# Patient Record
Sex: Male | Born: 1960 | Race: Black or African American | Hispanic: No | Marital: Married | Smoking: Never smoker
Health system: Southern US, Community
[De-identification: ages and names within clinical notes are randomized; demographics above are authoritative.]

## PROBLEM LIST (undated history)

## (undated) DIAGNOSIS — IMO0001 Reserved for inherently not codable concepts without codable children: Secondary | ICD-10-CM

## (undated) DIAGNOSIS — E669 Obesity, unspecified: Secondary | ICD-10-CM

## (undated) DIAGNOSIS — R030 Elevated blood-pressure reading, without diagnosis of hypertension: Secondary | ICD-10-CM

## (undated) HISTORY — DX: Reserved for inherently not codable concepts without codable children: IMO0001

## (undated) HISTORY — DX: Obesity, unspecified: E66.9

## (undated) HISTORY — PX: COLONOSCOPY: SHX174

## (undated) HISTORY — DX: Elevated blood-pressure reading, without diagnosis of hypertension: R03.0

## (undated) HISTORY — PX: VASECTOMY: SHX75

---

## 2005-09-26 ENCOUNTER — Ambulatory Visit: Payer: Self-pay | Admitting: Internal Medicine

## 2005-10-04 ENCOUNTER — Ambulatory Visit: Payer: Self-pay | Admitting: Internal Medicine

## 2006-10-12 ENCOUNTER — Encounter: Payer: Self-pay | Admitting: Internal Medicine

## 2009-04-12 ENCOUNTER — Ambulatory Visit: Payer: Self-pay | Admitting: Internal Medicine

## 2009-04-12 DIAGNOSIS — R030 Elevated blood-pressure reading, without diagnosis of hypertension: Secondary | ICD-10-CM | POA: Insufficient documentation

## 2009-04-12 DIAGNOSIS — E669 Obesity, unspecified: Secondary | ICD-10-CM | POA: Insufficient documentation

## 2009-04-13 ENCOUNTER — Encounter: Payer: Self-pay | Admitting: Internal Medicine

## 2009-10-12 ENCOUNTER — Ambulatory Visit: Payer: Self-pay | Admitting: Internal Medicine

## 2009-10-12 DIAGNOSIS — R748 Abnormal levels of other serum enzymes: Secondary | ICD-10-CM | POA: Insufficient documentation

## 2009-10-12 DIAGNOSIS — R7309 Other abnormal glucose: Secondary | ICD-10-CM | POA: Insufficient documentation

## 2009-10-12 LAB — CONVERTED CEMR LAB
ALT: 26 units/L (ref 0–53)
AST: 24 units/L (ref 0–37)
Albumin: 4.2 g/dL (ref 3.5–5.2)
BUN: 13 mg/dL (ref 6–23)
CO2: 27 meq/L (ref 19–32)
Chloride: 106 meq/L (ref 96–112)
GGT: 33 units/L (ref 7–51)
Glucose, Bld: 83 mg/dL (ref 70–99)
Potassium: 4.4 meq/L (ref 3.5–5.3)

## 2009-10-14 ENCOUNTER — Telehealth: Payer: Self-pay | Admitting: Internal Medicine

## 2009-10-15 ENCOUNTER — Encounter: Payer: Self-pay | Admitting: Internal Medicine

## 2009-10-18 ENCOUNTER — Encounter (INDEPENDENT_AMBULATORY_CARE_PROVIDER_SITE_OTHER): Payer: Self-pay | Admitting: *Deleted

## 2009-11-01 ENCOUNTER — Encounter (HOSPITAL_COMMUNITY): Admission: RE | Admit: 2009-11-01 | Discharge: 2010-01-10 | Payer: Self-pay | Admitting: Internal Medicine

## 2009-11-03 ENCOUNTER — Telehealth: Payer: Self-pay | Admitting: Internal Medicine

## 2010-01-17 ENCOUNTER — Ambulatory Visit: Payer: Self-pay | Admitting: Internal Medicine

## 2010-03-06 LAB — CONVERTED CEMR LAB
AST: 25 units/L (ref 0–37)
Alkaline Phosphatase: 128 units/L — ABNORMAL HIGH (ref 39–117)
Bilirubin, Direct: 0.1 mg/dL (ref 0.0–0.3)
CO2: 27 meq/L (ref 19–32)
Calcium: 8.9 mg/dL (ref 8.4–10.5)
Creatinine, Ser: 1.02 mg/dL (ref 0.40–1.50)
Glucose, Bld: 88 mg/dL (ref 70–99)
HCT: 41 % (ref 39.0–52.0)
HDL: 64 mg/dL (ref >39)
MCV: 87.8 fL (ref 78.0–100.0)
Platelets: 254 10*3/uL (ref 150–400)
Total Bilirubin: 0.3 mg/dL (ref 0.3–1.2)
WBC: 5.5 10*3/uL (ref 4.0–10.5)

## 2010-03-08 NOTE — Letter (Signed)
   [place] at [facility] [address] [place], [place]  [address]  [place] Phone: [phone]      April 13, 2009   Jeffrey Oconnor [address] [place], [place] [address]  RE:  LAB RESULTS  Dear  [name],  The following is an interpretation of your most recent lab tests.  Please take note of any instructions provided or changes to medications that have resulted from your lab work.  ELECTROLYTES:  Good - no changes needed  KIDNEY FUNCTION TESTS:  Good - no changes needed  LIVER FUNCTION TESTS:  Stable - no changes needed  LIPID PANEL:  Good - no changes needed Triglyceride: 36   Cholesterol: 169   LDL: 98   HDL: 64   Chol/HDL%:  2.6 Ratio  THYROID STUDIES:  Thyroid studies normal TSH: 2.544      CBC:  Good - no changes needed  Your 3 month blood sugar test (HgA1c) is slightly elevated.  Please make lifestyle changes as discussed during our office visit.       Sincerely Yours,    Dr. Thomos Lemons

## 2010-03-08 NOTE — Assessment & Plan Note (Signed)
Summary: 6 month follow up/mhf   Vital Signs:  Patient profile:   50 year old male Height:      68 inches Weight:      224.25 pounds BMI:     34.22 O2 Sat:      99 % on Room air Temp:     98.0 degrees F oral Pulse rate:   58 / minute Pulse rhythm:   regular Resp:     20 per minute BP sitting:   138 / 90  (right arm) Cuff size:   large  Vitals Entered By: Glendell Docker CMA (October 12, 2009 9:09 AM)  O2 Flow:  Room air  Primary Care Provider:  D. Thomos Lemons DO   History of Present Illness: 50 y/o male with hx of elevated bp w/o dx of htn for f/u he has not been monitoring home bp readings reviewed prev blood test results  Preventive Screening-Counseling & Management  Alcohol-Tobacco     Smoking Status: never  Allergies (verified): No Known Drug Allergies  Past History:  Past Medical History: Hx of elevated alk phos Obesity Elevated BP w/o diagnosis of Htn   Past Surgical History: Denies surgical history   Family History: Family History Hypertension - mother No diabetes No colon ca no prostate ca    Social History: Occupation: Presenter, 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   Lungs:  normal respiratory effort, normal breath sounds, and no wheezes.   Heart:  normal rate, regular rhythm, and no gallop.   Abdomen:  soft and non-tender.  mild abd obesity Extremities:  No lower extremity edema    Impression & Recommendations:  Problem # 1:  HYPERGLYCEMIA (ICD-790.29) A1c from March, 2011 6.0 - borderline.  brother is diabetic. Pt counseled on diet and exercise. I provided educational material re:  carb counting  Orders: T- Hemoglobin A1C ([id number]) T-Basic Metabolic Panel ([id number])  Problem # 2:  ELEVATED BLOOD PRESSURE WITHOUT DIAGNOSIS OF HYPERTENSION (ICD-796.2) pt has not been monitoring bp at home.   limited exercise.  wife is CMA.   If home BP readings > 140/80, we discussed started anti hypertensive  BP today: 138/90 Prior BP: 130/100 (04/12/2009)  Labs Reviewed: Creat: 1.02 (04/12/2009) Chol: 169 (04/12/2009)   HDL: 64 (04/12/2009)   LDL: 98 (04/12/2009)   TG: 36 (04/12/2009)  Problem # 3:  ALKALINE PHOSPHATASE, ELEVATED (ICD-790.5) pt with isolated alk phos elevation.  question liver vs bone source.  check GGTP Orders: T-Hepatic Function ([id number]) T- * Misc. Laboratory test [id number])  Patient Instructions: 1)  Please schedule a follow-up appointment in 3 months. 2)  Bring blood pressure readings to your next OV  Current Allergies (reviewed today): No known allergies       Contraindications/Deferment of Procedures/Staging:    Test/Procedure: FLU VAX    Reason for deferment: patient declined

## 2010-03-08 NOTE — Progress Notes (Signed)
Summary: Lab Results  Phone Note Outgoing Call   Summary of Call: call pt - electrolytes and kidney function normal.   Alkaline phosphatase still slightly elevated.  elevation does not appear to be coming from liver.   I may be related to bone abnormality.  I suggest we schedule bone scan.  see orders Initial call taken by: D. Thomos Lemons DO,  October 14, 2009 2:07 PM  Follow-up for Phone Call        call placed to patient at [phone], no answer, no voice mail Glendell Docker [facility]  October 14, 2009 3:10 PM   Additional Follow-up for Phone Call Additional follow up Details #1::        Attempted to reach pt. No answer, no voicemail. Nicki Guadalajara Fergerson CMA Duncan Dull)  October 15, 2009 8:05 AM     Additional Follow-up for Phone Call Additional follow up Details #2::    letter mailed to patient informin him per Dr Artist Pais instructions Follow-up by: Glendell Docker CMA,  October 15, 2009 9:08 AM

## 2010-03-08 NOTE — Assessment & Plan Note (Signed)
Summary: Fasting ncpx- jr   Vital Signs:  Patient profile:   50 year old male Height:      68 inches Weight:      221.50 pounds BMI:     33.80 O2 Sat:      100 % on Room air Temp:     97.7 degrees F rectal Pulse rate:   59 / minute Pulse rhythm:   regular Resp:     18 per minute BP sitting:   130 / 100  (right arm) Cuff size:   large  Vitals Entered By: Glendell Docker CMA (April 12, 2009 9:39 AM)  O2 Flow:  Room air CC: RM 2- NPX Is Patient Diabetic? No Comments fasting for labs, new patient physical   Primary Care Provider:  Dondra Spry DO  CC:  RM 2- NPX.  History of Present Illness: 50 y/o male to establish and for routine  hx of  elevated BP,   gave blood 2-3 weeks ago - bp was 122/80 does not exercise regularly wt history -  highest wt 230 no hx of DM II no hx of hyperlipidemia   Preventive Screening-Counseling & Management  Alcohol-Tobacco     Alcohol drinks/day: 0     Smoking Status: never  Caffeine-Diet-Exercise     Caffeine use/day: less than one er day     Does Patient Exercise: no  Allergies (verified): No Known Drug Allergies  Past History:  Past Medical History: Hx of elevated alk phos Obesity Elevated BP w/o diagnosis of Htn  Past Surgical History: Denies surgical history  Family History: Family History Hypertension - mother No diabetes No colon ca no prostate ca  Social History: Occupation: Presenter, 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    Physical Exam  General:  alert, well-developed, and well-nourished.   Neck:  supple, no masses, and no carotid bruits.   Lungs:  normal respiratory effort,  normal breath sounds, and no wheezes.   Heart:  normal rate, regular rhythm, and no gallop.   Abdomen:  soft, non-tender, normal bowel sounds, no abdominal hernia, and no hepatomegaly.   Extremities:  No lower extremity edema  Neurologic:  cranial nerves II-XII intact and gait normal.   Psych:  normally interactive, good eye contact, not anxious appearing, and not depressed appearing.     Impression & Recommendations:  Problem # 1:  PREVENTIVE HEALTH CARE (ICD-V70.0) Reviewed adult health maintenance protocols.  Pt counseled on diet and exercise.  Orders: T-Basic Metabolic Panel [id number]) T-Hepatic Function [phone]) T-Lipid Profile [id number]) T-CBC No Diff ([id number]) T- Hemoglobin A1C ([id number]) T-TSH ([id number]) EKG w/ Interpretation (93000)  D  Patient Instructions: 1)  Please schedule a follow-up appointment in 6 months. 2)  It is important that you exercise regularly at least 20 minutes 5 times a week.  3)  [email or web address]-calorie-counter.com/ 4)  limit your calories to 2000-2200 cal per day. 5)  drink 6 cups of water per day. 6)  Avoid concentrated sweets and decrease your carbohydrate intake as discussed 7)  Limit your salt intake to 2 grams per day  Current Allergies (reviewed today): No known allergies

## 2010-03-08 NOTE — Letter (Signed)
Summary: Generic Letter  [place] at [facility]  [address]   [place], [place] [address]   Phone: [phone]  Fax: [phone]      10/15/2009    Jeffrey Oconnor [address] [place], [place]  [address]     Dear [name],  Several attempts have been made to contact you at [phone] and have been unsuccessful. Dr Artist Pais asked that you be informed your electrolytes and kidney function are normal. However your alkaline phosphatase is still slightly elevate, elevation does not appear to be coming from liver. It may be related to a bone abnormality. Dr Artist Pais is suggest having a bone scan. Our office will set the appointment up for you and contact you with the appointment information.    Please call our office if any questions.         Sincerely,   Glendell Docker CMA Dr Thomos Lemons

## 2010-03-08 NOTE — Progress Notes (Signed)
Summary: Bone Scan Results  Phone Note Outgoing Call   Summary of Call: call pt - bone scan - negative  Initial call taken by: D. Thomos Lemons DO,  November 03, 2009 11:59 AM  Follow-up for Phone Call        call placed  to patient at [phone],no answer. A detalied voice message was left informing patient per Dr Artist Pais instructions. Message was left for patient to call if any questions Follow-up by: Glendell Docker CMA,  November 03, 2009 1:16 PM

## 2010-03-08 NOTE — Letter (Signed)
Summary: Primary Care Consult Scheduled Letter  [place] at [facility]  [address]   [place], [place] [address]   Phone: [phone]  Fax: [phone]      10/18/2009 MRN: [id number]  Tyheem Elders [address] [place], [place]  [address]    Dear Mr. Bonny,      We have scheduled an appointment for you.  At the recommendation of Dr.YOO, we have scheduled you a WHOLE BODY BONE SCAN on November 01 2009 at [facility].  Their address [address] [place] [address]. The office phone number is [phone].  If this appointment day and time is not convenient for you, please feel free to call the office of the doctor you are being referred to at the number listed above and reschedule the appointment.      PLEASE REGISTER IN ADMITTING AT  8:30     It is important for you to keep your scheduled appointments. We are here to make sure you are given good patient care. If you have questions or you have made changes to your appointment, please notify us at  [phone], ask for HELEN.    Thank you,  Darral Dash Patient Care Coordinator [place] [facility]

## 2010-06-24 NOTE — Assessment & Plan Note (Signed)
[facility]                             PRIMARY CARE OFFICE NOTE   Jeffrey Oconnor, Jeffrey Oconnor                       MRN:          [id number]  DATE:10/04/2005                            DOB:          [birth year]    The patient is a 50 year old male who presents for examination.   ALLERGIES:  None.   PAST MEDICAL HISTORY, FAMILY HISTORY, SOCIAL HISTORY:  As per Jun 16, 2003  note.  He has been eating healthy and exercising. His parents are still  living.   CURRENT MEDICATIONS:  None.   REVIEW OF SYSTEMS:  Negative for chest pain or shortness of breath. No  syncope. No urologic complaints. The rest is negative.   PHYSICAL EXAMINATION:  VITAL SIGNS:  Blood pressure 169/94, pulse 75,  temperature 98.6. Weight 218 pounds.  HEENT:  Moist mucosa.  NECK:  Supple, no thyromegaly or bruit.  LUNGS:  Clear, no wheeze or rales.  HEART:  S1, S2 no gallop, no murmur.  ABDOMEN:  Soft, nontender, no organomegaly, masses.  LOWER EXTREMITIES:  Without edema.  NEURO:  He is alert and oriented and cooperative. Does not seem depressed.  SKIN:  Clear.  RECTAL REVIEW:  Normal prostate, stool Guaiac negative. No masses.   LABS ON September 27, 2005:  CBC normal. Cholesterol 179, LDL 114, alkaline  phosphatase 123. TSH normal. Urinalysis normal. EKG with normal sinus  rhythm.   ASSESSMENT AND PLAN:  1. Normal wellness examination. Age/health-related issues discussed.      Healthy lifestyle discussed.  2. Repeat exam in 12 months. Obtain chest x-ray.  3. Elevated alkaline phosphatase likely due to his dilated gallbladder.      Will monitor.  4. Elevated blood pressure. Have asked him to check blood pressure at      home. If greater than 145/95 he will start Adenocard 20 mg daily with      samples and prescriptions provided.                                  Sonda Primes, MD   AP/MedQ  DD:  10/05/2005  DT:  10/05/2005  Job #:  [id number]

## 2010-09-02 ENCOUNTER — Encounter: Payer: Self-pay | Admitting: Internal Medicine

## 2010-09-05 ENCOUNTER — Encounter: Payer: Self-pay | Admitting: Internal Medicine

## 2010-09-05 ENCOUNTER — Ambulatory Visit (INDEPENDENT_AMBULATORY_CARE_PROVIDER_SITE_OTHER): Payer: Federal, State, Local not specified - PPO | Admitting: Internal Medicine

## 2010-09-05 VITALS — BP 130/90 | HR 63 | Temp 97.9°F | Ht 68.0 in | Wt 217.0 lb

## 2010-09-05 DIAGNOSIS — Z1211 Encounter for screening for malignant neoplasm of colon: Secondary | ICD-10-CM

## 2010-09-05 DIAGNOSIS — Z0000 Encounter for general adult medical examination without abnormal findings: Secondary | ICD-10-CM

## 2010-09-05 LAB — TSH: TSH: 0.973 u[IU]/mL (ref 0.350–4.500)

## 2010-09-05 LAB — LIPID PANEL
HDL: 63 mg/dL (ref >39)
LDL Cholesterol: 103 mg/dL — ABNORMAL HIGH (ref 0–99)
Triglycerides: 34 mg/dL (ref <150)
VLDL: 7 mg/dL (ref 0–40)

## 2010-09-05 LAB — PSA: PSA: 1.61 ng/mL (ref <=4.00)

## 2010-09-05 LAB — BASIC METABOLIC PANEL
BUN: 14 mg/dL (ref 6–23)
CO2: 25 mEq/L (ref 19–32)
Chloride: 101 mEq/L (ref 96–112)
Potassium: 4.2 mEq/L (ref 3.5–5.3)

## 2010-09-05 LAB — URINALYSIS
Leukocytes, UA: NEGATIVE
Nitrite: NEGATIVE
Protein, ur: NEGATIVE mg/dL
Urobilinogen, UA: 0.2 mg/dL (ref 0.0–1.0)

## 2010-09-05 LAB — HEPATIC FUNCTION PANEL
ALT: 28 U/L (ref 0–53)
Total Protein: 7.7 g/dL (ref 6.0–8.3)

## 2010-09-05 NOTE — Progress Notes (Signed)
  Subjective:    Patient ID: Jeffrey Oconnor, male    DOB: 04-03-1960, 50 y.o.   MRN: [id number]  HPI Pt presents to clinic for annual physical. No current complaints. BP minimally elevated without sx's. States wife checks his bp at home and ? Nl. Denies having undergone colonoscopy in the past.  Reviewed pmh, medications and allergies.    Review of Systems  Constitutional: Negative for fever, chills and fatigue.  Respiratory: Negative for cough and shortness of breath.   Cardiovascular: Negative for chest pain and palpitations.  Gastrointestinal: Negative for abdominal pain and blood in stool.  All other systems reviewed and are negative.       Objective:   Physical Exam  Nursing note and vitals reviewed. Constitutional: He appears well-developed and well-nourished. No distress.  HENT:  Head: Normocephalic and atraumatic.  Right Ear: Tympanic membrane, external ear and ear canal normal.  Left Ear: Tympanic membrane, external ear and ear canal normal.  Nose: Nose normal.  Mouth/Throat: Oropharynx is clear and moist. No oropharyngeal exudate.  Eyes: Conjunctivae and EOM are normal. Pupils are equal, round, and reactive to light. Right eye exhibits no discharge. Left eye exhibits no discharge. No scleral icterus.  Neck: Normal range of motion. Neck supple. No JVD present. Carotid bruit is not present. No thyromegaly present.  Cardiovascular: Normal rate, regular rhythm and normal heart sounds.  Exam reveals no gallop and no friction rub.   No murmur heard. Pulmonary/Chest: Effort normal and breath sounds normal. No respiratory distress. He has no wheezes. He has no rales.  Abdominal: Soft. Bowel sounds are normal. He exhibits no mass. There is no tenderness. There is no rebound and no guarding.  Lymphadenopathy:    He has no cervical adenopathy.  Neurological: He is alert.  Skin: Skin is warm and dry. No rash noted. He is not diaphoretic.  Psychiatric: He has a normal mood and  affect.          Assessment & Plan:   No problem-specific assessment & plan notes found for this encounter.

## 2010-09-05 NOTE — Assessment & Plan Note (Signed)
Nl exam. Refer for screening colonoscopy. Obtain cbc, chem7, lft, lipid, u/a, tsh and psa (discussed pro's and cons). EKG obtained demonstrates nsr with nl intervals and axis. Recommend outpt blood pressure log to be reviewed.

## 2010-09-06 LAB — CBC WITH DIFFERENTIAL/PLATELET
Basophils Absolute: 0 10*3/uL (ref 0.0–0.1)
Hemoglobin: 14.3 g/dL (ref 13.0–17.0)
Lymphocytes Relative: 30 % (ref 12–46)
Lymphs Abs: 1.9 10*3/uL (ref 0.7–4.0)
MCH: 28.9 pg (ref 26.0–34.0)
MCHC: 33.4 g/dL (ref 30.0–36.0)
Monocytes Absolute: 0.7 10*3/uL (ref 0.1–1.0)
Neutro Abs: 3.6 10*3/uL (ref 1.7–7.7)
Platelets: 233 10*3/uL (ref 150–400)

## 2010-09-12 ENCOUNTER — Telehealth: Payer: Self-pay | Admitting: Internal Medicine

## 2010-09-12 NOTE — Telephone Encounter (Signed)
Patient is requesting last A1C reading.

## 2010-09-13 NOTE — Telephone Encounter (Signed)
Call placed to patient at [phone], , his wife stated he was not available. She was informed patients A1C was 5.9 on 10/12/2009, and patient did not have an A1c drawn with last office visit. She stated that she will forward the information to patient. No additional concerns at this time

## 2010-09-29 ENCOUNTER — Ambulatory Visit (AMBULATORY_SURGERY_CENTER): Payer: Federal, State, Local not specified - PPO | Admitting: *Deleted

## 2010-09-29 ENCOUNTER — Encounter: Payer: Self-pay | Admitting: Internal Medicine

## 2010-09-29 VITALS — Ht 68.0 in | Wt 215.7 lb

## 2010-09-29 DIAGNOSIS — Z1211 Encounter for screening for malignant neoplasm of colon: Secondary | ICD-10-CM

## 2010-09-29 MED ORDER — NA SULFATE-K SULFATE-MG SULF 17.5-3.13-1.6 GM/177ML PO SOLN: 1.0000 | Freq: Once | ORAL | Status: DC

## 2010-10-13 ENCOUNTER — Ambulatory Visit (AMBULATORY_SURGERY_CENTER): Payer: Federal, State, Local not specified - PPO | Admitting: Internal Medicine

## 2010-10-13 ENCOUNTER — Encounter: Payer: Self-pay | Admitting: Internal Medicine

## 2010-10-13 VITALS — BP 135/94 | HR 64 | Temp 97.7°F | Resp 12 | Ht 68.0 in | Wt 215.0 lb

## 2010-10-13 DIAGNOSIS — Z1211 Encounter for screening for malignant neoplasm of colon: Secondary | ICD-10-CM

## 2010-10-13 MED ORDER — SODIUM CHLORIDE 0.9 % IV SOLN: 500.0000 mL | INTRAVENOUS | Status: DC

## 2010-10-14 ENCOUNTER — Telehealth: Payer: Self-pay | Admitting: *Deleted

## 2010-10-14 NOTE — Telephone Encounter (Signed)

## 2017-03-14 DIAGNOSIS — R350 Frequency of micturition: Secondary | ICD-10-CM | POA: Diagnosis not present

## 2017-03-14 DIAGNOSIS — N390 Urinary tract infection, site not specified: Secondary | ICD-10-CM | POA: Diagnosis not present

## 2017-03-14 DIAGNOSIS — R3915 Urgency of urination: Secondary | ICD-10-CM | POA: Diagnosis not present

## 2017-03-26 DIAGNOSIS — R030 Elevated blood-pressure reading, without diagnosis of hypertension: Secondary | ICD-10-CM | POA: Diagnosis not present

## 2017-03-26 DIAGNOSIS — R600 Localized edema: Secondary | ICD-10-CM | POA: Diagnosis not present

## 2017-03-26 DIAGNOSIS — E663 Overweight: Secondary | ICD-10-CM | POA: Diagnosis not present

## 2017-05-01 DIAGNOSIS — R030 Elevated blood-pressure reading, without diagnosis of hypertension: Secondary | ICD-10-CM | POA: Diagnosis not present

## 2017-05-01 DIAGNOSIS — Z23 Encounter for immunization: Secondary | ICD-10-CM | POA: Diagnosis not present

## 2017-05-01 DIAGNOSIS — R195 Other fecal abnormalities: Secondary | ICD-10-CM | POA: Diagnosis not present

## 2017-05-01 DIAGNOSIS — Z125 Encounter for screening for malignant neoplasm of prostate: Secondary | ICD-10-CM | POA: Diagnosis not present

## 2017-05-01 DIAGNOSIS — Z0000 Encounter for general adult medical examination without abnormal findings: Secondary | ICD-10-CM | POA: Diagnosis not present

## 2017-05-22 DIAGNOSIS — E7800 Pure hypercholesterolemia, unspecified: Secondary | ICD-10-CM | POA: Diagnosis not present

## 2017-05-22 DIAGNOSIS — E663 Overweight: Secondary | ICD-10-CM | POA: Diagnosis not present

## 2017-05-22 DIAGNOSIS — I10 Essential (primary) hypertension: Secondary | ICD-10-CM | POA: Diagnosis not present

## 2017-05-22 DIAGNOSIS — R195 Other fecal abnormalities: Secondary | ICD-10-CM | POA: Diagnosis not present

## 2018-05-30 DIAGNOSIS — N528 Other male erectile dysfunction: Secondary | ICD-10-CM | POA: Diagnosis not present

## 2018-05-30 DIAGNOSIS — N400 Enlarged prostate without lower urinary tract symptoms: Secondary | ICD-10-CM | POA: Diagnosis not present

## 2019-06-16 DIAGNOSIS — E7800 Pure hypercholesterolemia, unspecified: Secondary | ICD-10-CM | POA: Diagnosis not present

## 2019-06-16 DIAGNOSIS — Z125 Encounter for screening for malignant neoplasm of prostate: Secondary | ICD-10-CM | POA: Diagnosis not present

## 2019-06-16 DIAGNOSIS — I10 Essential (primary) hypertension: Secondary | ICD-10-CM | POA: Diagnosis not present

## 2019-06-16 DIAGNOSIS — Z0000 Encounter for general adult medical examination without abnormal findings: Secondary | ICD-10-CM | POA: Diagnosis not present

## 2019-06-16 DIAGNOSIS — Z131 Encounter for screening for diabetes mellitus: Secondary | ICD-10-CM | POA: Diagnosis not present

## 2019-06-16 DIAGNOSIS — R7989 Other specified abnormal findings of blood chemistry: Secondary | ICD-10-CM | POA: Diagnosis not present

## 2020-01-19 ENCOUNTER — Ambulatory Visit: Payer: Federal, State, Local not specified - PPO | Attending: Internal Medicine

## 2020-01-19 DIAGNOSIS — Z23 Encounter for immunization: Secondary | ICD-10-CM

## 2020-01-19 NOTE — Progress Notes (Signed)
   Covid-19 Vaccination Clinic  Name:  Jeffrey Oconnor    MRN: [id number] DOB: 19-Dec-1960  01/19/2020  [name] was observed post Covid-19 immunization for 15 minutes without incident. He was provided with Vaccine Information Sheet and instruction to access the V-Safe system.   [name] was instructed to call 911 with any severe reactions post vaccine: Marland Kitchen Difficulty breathing  . Swelling of face and throat  . A fast heartbeat  . A bad rash all over body  . Dizziness and weakness   Immunizations Administered    Name Date Dose VIS Date Route   Pfizer COVID-19 Vaccine 01/19/2020  1:32 PM 0.3 mL 11/26/2019 Intramuscular   Manufacturer: [place]   Lot: [id number]   NDC: [address]-8

## 2020-06-18 DIAGNOSIS — Z0000 Encounter for general adult medical examination without abnormal findings: Secondary | ICD-10-CM | POA: Diagnosis not present

## 2020-06-28 DIAGNOSIS — I10 Essential (primary) hypertension: Secondary | ICD-10-CM | POA: Diagnosis not present

## 2020-06-28 DIAGNOSIS — Z125 Encounter for screening for malignant neoplasm of prostate: Secondary | ICD-10-CM | POA: Diagnosis not present

## 2020-06-28 DIAGNOSIS — Z131 Encounter for screening for diabetes mellitus: Secondary | ICD-10-CM | POA: Diagnosis not present

## 2020-06-28 DIAGNOSIS — E7800 Pure hypercholesterolemia, unspecified: Secondary | ICD-10-CM | POA: Diagnosis not present

## 2020-06-28 DIAGNOSIS — E291 Testicular hypofunction: Secondary | ICD-10-CM | POA: Diagnosis not present

## 2020-06-28 DIAGNOSIS — Z0000 Encounter for general adult medical examination without abnormal findings: Secondary | ICD-10-CM | POA: Diagnosis not present

## 2020-06-28 DIAGNOSIS — Z1159 Encounter for screening for other viral diseases: Secondary | ICD-10-CM | POA: Diagnosis not present

## 2020-11-16 ENCOUNTER — Encounter: Payer: Self-pay | Admitting: Gastroenterology

## 2020-11-26 ENCOUNTER — Ambulatory Visit: Payer: Federal, State, Local not specified - PPO

## 2020-11-26 VITALS — Ht 67.0 in | Wt 208.0 lb

## 2020-11-26 DIAGNOSIS — Z1211 Encounter for screening for malignant neoplasm of colon: Secondary | ICD-10-CM

## 2020-11-26 MED ORDER — PLENVU 140 G PO SOLR: 140.0000 g | ORAL | 0 refills | Status: DC

## 2020-11-26 NOTE — Patient Instructions (Signed)
If you are age 60 or older, your body mass index should be between 23-30. Your Body mass index is 32.58 kg/m. If this is out of the aforementioned range listed, please consider follow up with your Primary Care Provider.  If you are age 18 or younger, your body mass index should be between 19-25. Your Body mass index is 32.58 kg/m. If this is out of the aformentioned range listed, please consider follow up with your Primary Care Provider.   ________________________________________________________  The [facility] providers would like to encourage you to use [facility] to communicate with providers for non-urgent requests or questions.  Due to long hold times on the telephone, sending your provider a message by [facility] may be a faster and more efficient way to get a response.  Please allow 48 business hours for a response.  Please remember that this is for non-urgent requests.  _______________________________________________________  [name] have been scheduled for a colonoscopy. Please follow written instructions given to you at your visit today.  Please pick up your prep supplies at the pharmacy within the next 1-3 days. If you use inhalers (even only as needed), please bring them with you on the day of your procedure.

## 2020-11-26 NOTE — Progress Notes (Signed)

## 2020-12-20 DIAGNOSIS — N528 Other male erectile dysfunction: Secondary | ICD-10-CM | POA: Diagnosis not present

## 2020-12-21 ENCOUNTER — Encounter: Payer: Federal, State, Local not specified - PPO | Admitting: Gastroenterology

## 2021-01-03 ENCOUNTER — Telehealth: Payer: Self-pay | Admitting: Gastroenterology

## 2021-01-03 MED ORDER — PLENVU 140 G PO SOLR: 1.0000 | Freq: Once | ORAL | 0 refills | Status: AC

## 2021-01-03 NOTE — Telephone Encounter (Signed)
Inbound call from patient. States pharmacy did not have a prescription for plenvu. His procedure is 12/1. Would like sent to [facility] on [address]

## 2021-01-03 NOTE — Telephone Encounter (Signed)
Plenvu sent to pt's pharmacy per request.

## 2021-01-04 NOTE — Telephone Encounter (Signed)
Inbound call from patient, states that the pharmacy still has not received prescription.

## 2021-01-06 ENCOUNTER — Ambulatory Visit (AMBULATORY_SURGERY_CENTER): Payer: Federal, State, Local not specified - PPO | Admitting: Gastroenterology

## 2021-01-06 ENCOUNTER — Encounter: Payer: Self-pay | Admitting: Gastroenterology

## 2021-01-06 ENCOUNTER — Other Ambulatory Visit: Payer: Self-pay | Admitting: Gastroenterology

## 2021-01-06 ENCOUNTER — Other Ambulatory Visit: Payer: Self-pay

## 2021-01-06 VITALS — BP 138/92 | HR 75 | Temp 98.2°F | Resp 20 | Ht 67.0 in | Wt 208.0 lb

## 2021-01-06 DIAGNOSIS — Z1211 Encounter for screening for malignant neoplasm of colon: Secondary | ICD-10-CM

## 2021-01-06 DIAGNOSIS — D123 Benign neoplasm of transverse colon: Secondary | ICD-10-CM

## 2021-01-06 DIAGNOSIS — D122 Benign neoplasm of ascending colon: Secondary | ICD-10-CM | POA: Diagnosis not present

## 2021-01-06 MED ORDER — SODIUM CHLORIDE 0.9 % IV SOLN: 500.0000 mL | Freq: Once | INTRAVENOUS | Status: DC

## 2021-01-06 NOTE — Progress Notes (Signed)
Called to room to assist during endoscopic procedure.  Patient ID and intended procedure confirmed with present staff. Received instructions for my participation in the procedure from the performing physician.  

## 2021-01-06 NOTE — Progress Notes (Signed)
To PACU, VSS. Report to [email or web address] 

## 2021-01-06 NOTE — Progress Notes (Signed)
Pt's states no medical or surgical changes since previsit or office visit.  ° °Vitals CW °

## 2021-01-06 NOTE — Patient Instructions (Signed)
Handout on polyps, hemorrhoids, and diverticulosis given to patient. Await pathology results. Resume previous diet and continue present medications. Recommendation on next colonoscopy for screening purposes will be determined based off of pathology results.    YOU HAD AN ENDOSCOPIC PROCEDURE TODAY AT [facility]:   Refer to the procedure report that was given to you for any specific questions about what was found during the examination.  If the procedure report does not answer your questions, please call your gastroenterologist to clarify.  If you requested that your care partner not be given the details of your procedure findings, then the procedure report has been included in a sealed envelope for you to review at your convenience later.  YOU SHOULD EXPECT: Some feelings of bloating in the abdomen. Passage of more gas than usual.  Walking can help get rid of the air that was put into your GI tract during the procedure and reduce the bloating. If you had a lower endoscopy (such as a colonoscopy or flexible sigmoidoscopy) you may notice spotting of blood in your stool or on the toilet paper. If you underwent a bowel prep for your procedure, you may not have a normal bowel movement for a few days.  Please Note:  You might notice some irritation and congestion in your nose or some drainage.  This is from the oxygen used during your procedure.  There is no need for concern and it should clear up in a day or so.  SYMPTOMS TO REPORT IMMEDIATELY:  Following lower endoscopy (colonoscopy or flexible sigmoidoscopy):  Excessive amounts of blood in the stool  Significant tenderness or worsening of abdominal pains  Swelling of the abdomen that is new, acute  Fever of 100F or higher  For urgent or emergent issues, a gastroenterologist can be reached at any hour by calling [phone]. Do not use MyChart messaging for urgent concerns.    DIET:  We do recommend a small meal at first,  but then you may proceed to your regular diet.  Drink plenty of fluids but you should avoid alcoholic beverages for 24 hours.  ACTIVITY:  You should plan to take it easy for the rest of today and you should NOT DRIVE or use heavy machinery until tomorrow (because of the sedation medicines used during the test).    FOLLOW UP: Our staff will call the number listed on your records 48-72 hours following your procedure to check on you and address any questions or concerns that you may have regarding the information given to you following your procedure. If we do not reach you, we will leave a message.  We will attempt to reach you two times.  During this call, we will ask if you have developed any symptoms of COVID 19. If you develop any symptoms (ie: fever, flu-like symptoms, shortness of breath, cough etc.) before then, please call [phone].  If you test positive for Covid 19 in the 2 weeks post procedure, please call and report this information to [place].    If any biopsies were taken you will be contacted by phone or by letter within the next 1-3 weeks.  Please call us at [phone] if you have not heard about the biopsies in 3 weeks.    SIGNATURES/CONFIDENTIALITY: You and/or your care partner have signed paperwork which will be entered into your electronic medical record.  These signatures attest to the fact that that the information above on your After Visit Summary has been reviewed and is  understood.  Full responsibility of the confidentiality of this discharge information lies with you and/or your care-partner.

## 2021-01-06 NOTE — Progress Notes (Signed)
   Referring Provider: Thornton Park, MD Primary Care Physician:  Pcp, No  Reason for Procedure:  Colon cancer screening   IMPRESSION:  Need for colon cancer screening Appropriate candidate for monitored anesthesia care  PLAN: Colonoscopy in the [facility] today   HPI: Jeffrey Oconnor is a 60 y.o. male presents for screening colonoscopy.  Screening colonoscopy with Dr. Olevia Perches 2012 revealed only diverticulosis.  No baseline GI symptoms.   No known family history of colon cancer or polyps. No family history of uterine/endometrial cancer, pancreatic cancer or gastric/stomach cancer.   Past Medical History:  Diagnosis Date   Elevated alkaline phosphatase level    Elevated BP    w/o diagnosis of htn   Obesity     Past Surgical History:  Procedure Laterality Date   VASECTOMY      Current Outpatient Medications  Medication Sig Dispense Refill   Cholecalciferol (VITAMIN D3) 50 MCG (2000 UT) capsule Take 2,000 Units by mouth daily.     Multiple Vitamin (MULTIVITAMIN) tablet Take 1 tablet by mouth daily.     No current facility-administered medications for this visit.    Allergies as of 01/06/2021   (No Known Allergies)    Family History  Problem Relation Age of Onset   Hypertension Mother    Diabetes Brother    Cancer Neg Hx        no colon or prostate   Colon cancer Neg Hx    Esophageal cancer Neg Hx    Stomach cancer Neg Hx      Physical Exam: General:   Alert,  well-nourished, pleasant and cooperative in NAD Head:  Normocephalic and atraumatic. Eyes:  Sclera clear, no icterus.   Conjunctiva pink. Mouth:  No deformity or lesions.   Neck:  Supple; no masses or thyromegaly. Lungs:  Clear throughout to auscultation.   No wheezes. Heart:  Regular rate and rhythm; no murmurs. Abdomen:  Soft, non-tender, nondistended, normal bowel sounds, no rebound or guarding.  Msk:  Symmetrical. No boney deformities LAD: No inguinal or umbilical LAD Extremities:  No clubbing  or edema. Neurologic:  Alert and  oriented x4;  grossly nonfocal Skin:  No obvious rash or bruise. Psych:  Alert and cooperative. Normal mood and affect.     Studies/Results: No results found.    [name] L. Tarri Glenn, MD, MPH 01/06/2021, 1:32 PM

## 2021-01-06 NOTE — Op Note (Signed)
[name] Procedure Date: 01/06/2021 1:26 PM MRN: [id number] Endoscopist: Thornton Park MD, MD Age: 60 Referring MD:  Date of Birth: Dec 26, 1960 Gender: Male Account #: [id number] Procedure:                Colonoscopy Indications:              Screening for colorectal malignant neoplasm                           Normal screening colonoscopy 10 years ago                           No known family history of colon cancer or polyps Medicines:                Monitored Anesthesia Care Procedure:                Pre-Anesthesia Assessment:                           - Prior to the procedure, a History and Physical                            was performed, and patient medications and                            allergies were reviewed. The patient's tolerance of                            previous anesthesia was also reviewed. The risks                            and benefits of the procedure and the sedation                            options and risks were discussed with the patient.                            All questions were answered, and informed consent                            was obtained. Prior Anticoagulants: The patient has                            taken no previous anticoagulant or antiplatelet                            agents. ASA Grade Assessment: II - A patient with                            mild systemic disease. After reviewing the risks                            and benefits, the patient was deemed in  satisfactory condition to undergo the procedure.                           After obtaining informed consent, the colonoscope                            was passed under direct vision. Throughout the                            procedure, the patient's blood pressure, pulse, and                            oxygen saturations were monitored continuously. The                            Olympus CF-HQ190L  [id number]) Colonoscope was                            introduced through the anus and advanced to the 2                            cm into the ileum. A second forward view of the                            right colon was performed. The colonoscopy was                            performed without difficulty. The patient tolerated                            the procedure well. The quality of the bowel                            preparation was good. The terminal ileum, ileocecal                            valve, appendiceal orifice, and rectum were                            photographed. Scope In: 2:15:16 PM Scope Out: 2:29:44 PM Scope Withdrawal Time: 0 hours 11 minutes 56 seconds  Total Procedure Duration: 0 hours 14 minutes 28 seconds  Findings:                 The perianal and digital rectal examinations were                            normal.                           Non-bleeding internal hemorrhoids were found.                            Diverticulosis present throughout the sigmoid  colon, descending colon, and in the ascending colon.                           A 1-2 mm polyp was found in the transverse colon.                            The polyp was sessile. The polyp was removed with a                            cold biopsy forceps. Resection and retrieval were                            complete. Estimated blood loss was minimal.                           A 1 mm polyp was found in the ascending colon. The                            polyp was sessile. The polyp was removed with a                            cold biopsy forceps. Resection and retrieval were                            complete. Estimated blood loss was minimal.                           The exam was otherwise without abnormality on                            direct and retroflexion views. Complications:            No immediate complications. Estimated blood loss:                             Minimal. Estimated Blood Loss:     Estimated blood loss was minimal. Impression:               - Non-bleeding internal hemorrhoids.                           - Diverticulosis in the sigmoid colon, descending                            colon, and ascending colon.                           - One 1 mm polyp in the transverse colon, removed                            with a cold biopsy forceps. Resected and retrieved.                           - One 1 mm polyp in the ascending colon,  removed                            with a cold biopsy forceps. Resected and retrieved.                           - The examination was otherwise normal on direct                            and retroflexion views. Recommendation:           - Patient has a contact number available for                            emergencies. The signs and symptoms of potential                            delayed complications were discussed with the                            patient. Return to normal activities tomorrow.                            Written discharge instructions were provided to the                            patient.                           - Resume previous diet. High fiber diet recommended.                           - Continue present medications.                           - Await pathology results.                           - Repeat colonoscopy date to be determined after                            pending pathology results are reviewed for                            surveillance.                           - Emerging evidence supports eating a diet of                            fruits, vegetables, grains, calcium, and yogurt                            while reducing red meat and alcohol may reduce the  risk of colon cancer.                           - Thank you for allowing me to be involved in your                            colon cancer prevention. Thornton Park MD, MD 01/06/2021  2:34:31 PM This report has been signed electronically.

## 2021-01-10 ENCOUNTER — Telehealth: Payer: Self-pay

## 2021-01-10 ENCOUNTER — Telehealth: Payer: Self-pay | Admitting: *Deleted

## 2021-01-10 NOTE — Telephone Encounter (Signed)
  Follow up Call-  Call back number 01/06/2021  Post procedure Call Back phone  # [phone]- cell  Permission to leave phone message Yes  Some recent data might be hidden   [facility]

## 2021-01-10 NOTE — Telephone Encounter (Signed)
Attempted to reach patient for post-procedure f/u call. No answer. Unable to leave message.

## 2021-01-18 ENCOUNTER — Encounter: Payer: Self-pay | Admitting: Gastroenterology

## 2021-12-21 DIAGNOSIS — E7800 Pure hypercholesterolemia, unspecified: Secondary | ICD-10-CM | POA: Diagnosis not present

## 2021-12-21 DIAGNOSIS — Z0000 Encounter for general adult medical examination without abnormal findings: Secondary | ICD-10-CM | POA: Diagnosis not present

## 2021-12-21 DIAGNOSIS — R7303 Prediabetes: Secondary | ICD-10-CM | POA: Diagnosis not present

## 2021-12-21 DIAGNOSIS — Z125 Encounter for screening for malignant neoplasm of prostate: Secondary | ICD-10-CM | POA: Diagnosis not present

## 2021-12-21 DIAGNOSIS — I10 Essential (primary) hypertension: Secondary | ICD-10-CM | POA: Diagnosis not present

## 2022-06-15 DIAGNOSIS — N528 Other male erectile dysfunction: Secondary | ICD-10-CM | POA: Diagnosis not present

## 2022-10-11 DIAGNOSIS — R7303 Prediabetes: Secondary | ICD-10-CM | POA: Diagnosis not present

## 2022-10-11 DIAGNOSIS — E7800 Pure hypercholesterolemia, unspecified: Secondary | ICD-10-CM | POA: Diagnosis not present

## 2022-10-11 DIAGNOSIS — I10 Essential (primary) hypertension: Secondary | ICD-10-CM | POA: Diagnosis not present

## 2023-04-20 DIAGNOSIS — H15122 Nodular episcleritis, left eye: Secondary | ICD-10-CM | POA: Diagnosis not present

## 2023-10-08 ENCOUNTER — Ambulatory Visit
Admission: EM | Admit: 2023-10-08 | Discharge: 2023-10-08 | Disposition: A | Discharge status: Home / Self Care | Attending: Physician Assistant | Admitting: Physician Assistant

## 2023-10-08 DIAGNOSIS — H6123 Impacted cerumen, bilateral: Secondary | ICD-10-CM

## 2023-10-08 NOTE — ED Triage Notes (Signed)
 Pt reports to UC for bilateral ear clogging that started this morning. Denies pain

## 2023-10-08 NOTE — ED Provider Notes (Signed)
 [facility]    CSN: [id number] Arrival date & time: 10/08/23  1057      History   Chief Complaint No chief complaint on file.   HPI Jeffrey Oconnor is a 63 y.o. male.   Patient here today for evaluation of sensation of having both of his ears are clogged.  He does have decreased hearing as a result.  He denies any associated pain.  He does not report any congestion or sore throat.  They have tried to use peroxide drops in his ears without resolution.  The history is provided by the patient.    Past Medical History:  Diagnosis Date   Elevated alkaline phosphatase level    Elevated BP    w/o diagnosis of htn   Obesity     Patient Active Problem List   Diagnosis Date Noted   Annual physical exam 09/05/2010   HYPERGLYCEMIA 10/12/2009   Abnormal levels of other serum enzymes 10/12/2009   OBESITY 04/12/2009   ELEVATED BLOOD PRESSURE WITHOUT DIAGNOSIS OF HYPERTENSION 04/12/2009    Past Surgical History:  Procedure Laterality Date   COLONOSCOPY     VASECTOMY         Home Medications    Prior to Admission medications   Medication Sig Start Date End Date Taking? Authorizing Provider  Cholecalciferol (VITAMIN D3) 50 MCG (2000 UT) capsule Take 2,000 Units by mouth daily.    [provider]  losartan-hydrochlorothiazide (HYZAAR) 50-12.5 MG tablet 1 tablet Patient not taking: Reported on 01/06/2021 06/16/19   [provider]  Multiple Vitamin (MULTIVITAMIN) tablet Take 1 tablet by mouth daily.    [provider]    Family History Family History  Problem Relation Age of Onset   Hypertension Mother    Diabetes Brother    Cancer Neg Hx        no colon or prostate   Colon cancer Neg Hx    Esophageal cancer Neg Hx    Stomach cancer Neg Hx     Social History Social History   Tobacco Use   Smoking status: Never   Smokeless tobacco: Never  Vaping Use   Vaping status: Never Used  Substance Use Topics   Alcohol use: No   Drug  use: No     Allergies   Patient has no known allergies.   Review of Systems Review of Systems  Constitutional:  Negative for chills and fever.  HENT:  Positive for hearing loss. Negative for congestion.   Eyes:  Negative for discharge and redness.  Respiratory:  Negative for shortness of breath.   Neurological:  Negative for numbness.     Physical Exam Triage Vital Signs ED Triage Vitals [10/08/23 1108]  Encounter Vitals Group     BP (!) 166/93     Girls Systolic BP Percentile      Girls Diastolic BP Percentile      Boys Systolic BP Percentile      Boys Diastolic BP Percentile      Pulse Rate 63     Resp 16     Temp 98.1 F (36.7 C)     Temp Source Oral     SpO2 97 %     Weight      Height      Head Circumference      Peak Flow      Pain Score 0     Pain Loc      Pain Education      Exclude  from Growth Chart    No data found.  Updated Vital Signs BP (!) 166/93 (BP Location: Left Arm)   Pulse 63   Temp 98.1 F (36.7 C) (Oral)   Resp 16   SpO2 97%   Visual Acuity Right Eye Distance:   Left Eye Distance:   Bilateral Distance:    Right Eye Near:   Left Eye Near:    Bilateral Near:     Physical Exam Vitals and nursing note reviewed.  Constitutional:      General: He is not in acute distress.    Appearance: Normal appearance. He is not ill-appearing.  HENT:     Head: Normocephalic and atraumatic.     Right Ear: There is impacted cerumen.     Left Ear: There is impacted cerumen.  Eyes:     Conjunctiva/sclera: Conjunctivae normal.  Cardiovascular:     Rate and Rhythm: Normal rate.  Pulmonary:     Effort: Pulmonary effort is normal. No respiratory distress.  Neurological:     Mental Status: He is alert.  Psychiatric:        Mood and Affect: Mood normal.        Behavior: Behavior normal.        Thought Content: Thought content normal.      UC Treatments / Results  Labs (all labs ordered are listed, but only abnormal results are  displayed) Labs Reviewed - No data to display  EKG   Radiology No results found.  Procedures Procedures (including critical care time)  Medications Ordered in UC Medications - No data to display  Initial Impression / Assessment and Plan / UC Course  I have reviewed the triage vital signs and the nursing notes.  Pertinent labs & imaging results that were available during my care of the patient were reviewed by me and considered in my medical decision making (see chart for details).    Ears irrigated in office with improvement of symptoms.  Recommended follow-up if no continued improvement or with any further concerns.  Final Clinical Impressions(s) / UC Diagnoses   Final diagnoses:  Bilateral impacted cerumen   Discharge Instructions   None    ED Prescriptions   None    PDMP not reviewed this encounter.   Billy Asberry FALCON, PA-C 10/08/23 1623
